# Patient Record
Sex: Male | Born: 2000 | Race: Black or African American | Hispanic: No | Marital: Single | Smoking: Never smoker
Health system: Southern US, Community
[De-identification: ages and names within clinical notes are randomized; demographics above are authoritative.]

## PROBLEM LIST (undated history)

## (undated) DIAGNOSIS — T7840XA Allergy, unspecified, initial encounter: Secondary | ICD-10-CM

## (undated) DIAGNOSIS — G43909 Migraine, unspecified, not intractable, without status migrainosus: Secondary | ICD-10-CM

## (undated) DIAGNOSIS — J45909 Unspecified asthma, uncomplicated: Secondary | ICD-10-CM

## (undated) HISTORY — DX: Allergy, unspecified, initial encounter: T78.40XA

## (undated) HISTORY — DX: Migraine, unspecified, not intractable, without status migrainosus: G43.909

## (undated) HISTORY — DX: Unspecified asthma, uncomplicated: J45.909

---
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
## 2018-01-05 NOTE — Progress Notes (Deleted)
  HPI:  Using dictation device. Unfortunately this device frequently misinterprets words/phrases. ? tdap  Allergy/Asthma: -meds: advised zyrtec, flonase and singulair daily, alb prn  ROS: See pertinent positives and negatives per HPI.  Past Medical History:  Diagnosis Date  . Allergy   . Asthma   . Migraine     No past surgical history on file.  Family History  Problem Relation Age of Onset  . Diabetes Mother   . Heart disease Father   . Hypertension Father   . Diabetes Maternal Grandmother   . Hypertension Maternal Grandmother   . Heart disease Maternal Grandmother   . Heart disease Other   . Diabetes Other   . Depression Other   . Hypertension Other   . Mental retardation Other   . Asthma Other   . Anemia Other     SOCIAL HX: ***   Current Outpatient Medications:  .  albuterol (PROVENTIL HFA;VENTOLIN HFA) 108 (90 Base) MCG/ACT inhaler, Inhale 2 puffs into the lungs every 6 (six) hours as needed., Disp: 1 Inhaler, Rfl: 1 .  fluticasone (FLONASE) 50 MCG/ACT nasal spray, Place 2 sprays into both nostrils daily., Disp: 16 g, Rfl: 11 .  montelukast (SINGULAIR) 10 MG tablet, Take 1 tablet (10 mg total) by mouth at bedtime., Disp: 30 tablet, Rfl: 3 .  NONFORMULARY OR COMPOUNDED ITEM, Lidocaine Nasal Spray  Use Q2 hours PRN headache, Disp: 1 each, Rfl: 1 .  SUMAtriptan (IMITREX) 50 MG tablet, Take 1 tablet (50 mg total) by mouth every 2 (two) hours as needed for migraine. May repeat in 2 hours if headache persists or recurs., Disp: 27 tablet, Rfl: 4  EXAM:  There were no vitals filed for this visit.  There is no height or weight on file to calculate BMI.  GENERAL: vitals reviewed and listed above, alert, oriented, appears well hydrated and in no acute distress  HEENT: atraumatic, conjunttiva clear, no obvious abnormalities on inspection of external nose and ears  NECK: no obvious masses on inspection  LUNGS: clear to auscultation bilaterally, no wheezes, rales or  rhonchi, good air movement  CV: HRRR, no peripheral edema  MS: moves all extremities without noticeable abnormality *** PSYCH: pleasant and cooperative, no obvious depression or anxiety  ASSESSMENT AND PLAN:  Discussed the following assessment and plan:  No diagnosis found.  *** -Patient advised to return or notify a doctor immediately if symptoms worsen or persist or new concerns arise.  There are no Patient Instructions on file for this visit.  Terressa KoyanagiHannah R Ninnie Fein, DO

## 2018-01-06 ENCOUNTER — Ambulatory Visit: Payer: Managed Care, Other (non HMO) | Admitting: Family Medicine

## 2018-01-06 DIAGNOSIS — Z0289 Encounter for other administrative examinations: Secondary | ICD-10-CM

## 2018-02-22 ENCOUNTER — Encounter: Payer: Self-pay | Admitting: Family Medicine

## 2018-02-22 ENCOUNTER — Ambulatory Visit: Payer: Managed Care, Other (non HMO) | Admitting: Family Medicine

## 2018-02-22 VITALS — BP 118/62 | HR 58 | Temp 98.5°F | Ht 74.2 in | Wt 188.0 lb

## 2018-02-22 DIAGNOSIS — R079 Chest pain, unspecified: Secondary | ICD-10-CM

## 2018-02-22 NOTE — Patient Instructions (Signed)
It was very nice to see you today!  You have a strain in one of the muscles in your chest wall.  You have no signs of heart damage or a heart blockage.  You can take ibuprofen or naproxen as needed for pain.  Please seek medical care if your symptoms worsen or do not improve over the next couple weeks.   Take care, Dr Jimmey RalphParker   Chest Wall Pain Chest wall pain is pain in or around the bones and muscles of your chest. Sometimes, an injury causes this pain. Sometimes, the cause may not be known. This pain may take several weeks or longer to get better. Follow these instructions at home: Pay attention to any changes in your symptoms. Take these actions to help with your pain:  Rest as told by your doctor.  Avoid activities that cause pain. Try not to use your chest, belly (abdominal), or side muscles to lift heavy things.  If directed, apply ice to the painful area: ? Put ice in a plastic bag. ? Place a towel between your skin and the bag. ? Leave the ice on for 20 minutes, 2-3 times per day.  Take over-the-counter and prescription medicines only as told by your doctor.  Do not use tobacco products, including cigarettes, chewing tobacco, and e-cigarettes. If you need help quitting, ask your doctor.  Keep all follow-up visits as told by your doctor. This is important.  Contact a doctor if:  You have a fever.  Your chest pain gets worse.  You have new symptoms. Get help right away if:  You feel sick to your stomach (nauseous) or you throw up (vomit).  You feel sweaty or light-headed.  You have a cough with phlegm (sputum) or you cough up blood.  You are short of breath. This information is not intended to replace advice given to you by your health care provider. Make sure you discuss any questions you have with your health care provider. Document Released: 01/09/2008 Document Revised: 12/29/2015 Document Reviewed: 10/18/2014 Elsevier Interactive Patient Education  [employer]2018  [employer]

## 2018-02-22 NOTE — Progress Notes (Signed)
   Subjective:  Ryan Simpson is a 17 y.o. male who presents today for same-day appointment with a chief complaint of chest pain.   HPI:  Chest Pain, Acute problem Started 1 week ago. Worsening over that time. Located to right chest wall.  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  He has tried using icy hot which has not helped.  No shortness of breath.  Worse with certain movements, deep inspiration, and cough.  Father recently had a heart stent placed and he is worried about a arterial blockage.  ROS: Per HPI  PMH: He reports that he has never smoked. He has never used smokeless tobacco. He reports that he does not drink alcohol or use drugs.  Objective:  Physical Exam: BP 118/62 (BP Location: Left Arm, Patient Position: Sitting, Cuff Size: Normal)   Pulse (!) 58   Temp 98.5 F (36.9 C) (Oral)   Ht 6' 2.2" (1.885 m)   Wt 188 lb (85.3 kg)   SpO2 98%   BMI 24.01 kg/m   Gen: NAD, resting comfortably CV: RRR with no murmurs appreciated Pulm: NWOB, CTAB with no crackles, wheezes, or rhonchi GI: Normal bowel sounds present. Soft, Nontender, Nondistended. MSK:  -Chest: Very tender to palpation over right pectoralis muscle.   EKG: Sinus bradycardia.  No ischemic changes.  Assessment/Plan:  Chest wall pain Likely secondary to pectoralis strain.  EKG today without ischemic changes.  No other red flag signs or symptoms.  Discussed treatment options with patient.  Offered prescription NSAID, however patient declined.  Recommended over-the-counter NSAIDs as needed.  Discussed reasons to return to care.  Follow up as needed.  Katina Degreealeb M. Jimmey RalphParker, MD 02/22/2018 12:59 PM

## 2018-04-02 ENCOUNTER — Emergency Department (HOSPITAL_COMMUNITY): Payer: Managed Care, Other (non HMO)

## 2018-04-02 ENCOUNTER — Other Ambulatory Visit: Payer: Self-pay

## 2018-04-02 ENCOUNTER — Emergency Department (HOSPITAL_COMMUNITY)
Admission: EM | Admit: 2018-04-02 | Discharge: 2018-04-03 | Disposition: A | Discharge status: Home / Self Care | Payer: Managed Care, Other (non HMO) | Attending: Emergency Medicine | Admitting: Emergency Medicine

## 2018-04-02 ENCOUNTER — Encounter (HOSPITAL_COMMUNITY): Payer: Self-pay

## 2018-04-02 DIAGNOSIS — Y929 Unspecified place or not applicable: Secondary | ICD-10-CM | POA: Diagnosis not present

## 2018-04-02 DIAGNOSIS — S6292XA Unspecified fracture of left wrist and hand, initial encounter for closed fracture: Secondary | ICD-10-CM | POA: Insufficient documentation

## 2018-04-02 DIAGNOSIS — Y939 Activity, unspecified: Secondary | ICD-10-CM | POA: Insufficient documentation

## 2018-04-02 DIAGNOSIS — J45909 Unspecified asthma, uncomplicated: Secondary | ICD-10-CM | POA: Diagnosis not present

## 2018-04-02 DIAGNOSIS — Y999 Unspecified external cause status: Secondary | ICD-10-CM | POA: Diagnosis not present

## 2018-04-02 DIAGNOSIS — S6992XA Unspecified injury of left wrist, hand and finger(s), initial encounter: Secondary | ICD-10-CM | POA: Diagnosis present

## 2018-04-02 MED ORDER — ACETAMINOPHEN 325 MG PO TABS
650.0000 mg | ORAL_TABLET | Freq: Once | ORAL | Status: AC
  Administered 2018-04-02: 650 mg via ORAL
  Filled 2018-04-02: qty 2

## 2018-04-02 NOTE — Discharge Instructions (Addendum)
Please keep splint clean and dry. I have provided a referral to a hand specialist, please schedule an appointment this week. You may alternate ibuprofen or tylenol for pain. If you experience any weakness in your hands, tingling or pain out of proportion return to the ED for reevaluation.
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
## 2018-04-03 NOTE — Progress Notes (Signed)
Orthopedic Tech Progress Note Patient Details:  Ryan Simpson 04/22/2001 [id number]  Ortho Devices Type of Ortho Device: Arm sling, Ulna gutter splint Ortho Device/Splint Location: lue Ortho Device/Splint Interventions: Ordered, Application, Adjustment   Post Interventions Patient Tolerated: Well Instructions Provided: Care of device, Adjustment of device   Trinna PostMartinez, Chrsitopher Wik J 04/03/2018, 12:25 AM

## 2018-04-06 ENCOUNTER — Emergency Department (HOSPITAL_COMMUNITY): Payer: Managed Care, Other (non HMO)

## 2018-04-06 ENCOUNTER — Emergency Department (HOSPITAL_COMMUNITY)
Admission: EM | Admit: 2018-04-06 | Discharge: 2018-04-06 | Disposition: A | Discharge status: Home / Self Care | Payer: Managed Care, Other (non HMO) | Attending: Emergency Medicine | Admitting: Emergency Medicine

## 2018-04-06 ENCOUNTER — Encounter (HOSPITAL_COMMUNITY): Payer: Self-pay | Admitting: Emergency Medicine

## 2018-04-06 DIAGNOSIS — J45909 Unspecified asthma, uncomplicated: Secondary | ICD-10-CM | POA: Diagnosis not present

## 2018-04-06 DIAGNOSIS — S62345D Nondisplaced fracture of base of fourth metacarpal bone, left hand, subsequent encounter for fracture with routine healing: Secondary | ICD-10-CM

## 2018-04-06 DIAGNOSIS — R202 Paresthesia of skin: Secondary | ICD-10-CM

## 2018-04-06 DIAGNOSIS — Y33XXXD Other specified events, undetermined intent, subsequent encounter: Secondary | ICD-10-CM | POA: Diagnosis not present

## 2018-04-06 DIAGNOSIS — M79642 Pain in left hand: Secondary | ICD-10-CM | POA: Diagnosis present

## 2018-04-06 MED ORDER — NAPROXEN 250 MG PO TABS
500.0000 mg | ORAL_TABLET | Freq: Once | ORAL | Status: AC
  Administered 2018-04-06: 500 mg via ORAL
  Filled 2018-04-06: qty 2

## 2018-04-06 MED ORDER — GABAPENTIN 300 MG PO CAPS: 300.0000 mg | ORAL_CAPSULE | Freq: Three times a day (TID) | ORAL | 0 refills | Status: AC

## 2018-04-06 NOTE — ED Notes (Signed)
Patient states he had a cast put on 8/28  C/o itching and burning to the top of his hand, so he took the cast off this am.

## 2018-04-06 NOTE — ED Notes (Signed)
Ortho tech here 

## 2018-04-06 NOTE — ED Notes (Signed)
Pt waiting on the ortho tech

## 2018-04-06 NOTE — ED Notes (Signed)
Ortho tech reports that he is coming in a few minutes

## 2018-04-06 NOTE — ED Notes (Signed)
Still waiting for othro tech

## 2018-04-06 NOTE — ED Notes (Signed)
Pt still waiting on the ortho tech  Ortho tech re-paged

## 2018-04-06 NOTE — Progress Notes (Signed)
Orthopedic Tech Progress Note Patient Details:  Ryan Simpson 04-04-2001 [id number]  Ortho Devices Type of Ortho Device: Ace wrap, Ulna gutter splint Ortho Device/Splint Location: lue Ortho Device/Splint Interventions: Application   Post Interventions Patient Tolerated: Well Instructions Provided: Care of device   [name] 04/06/2018, 5:23 PM

## 2018-04-06 NOTE — Discharge Instructions (Signed)
Please elevate the left arm and hand to reduce swelling Take Ibuprofen 600mg  for pain three times daily Take Gabapentin as needed for nerve pain Follow up with orthopedics on Thursday
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
## 2018-04-06 NOTE — ED Triage Notes (Signed)
Pt was seen here 8/28 and had a splint placed on his left hand. States he took it off because it was "burning his hand." Pt states his hand still feels like it is "burning." no new injuries. Pt has not followed up, his apt is Thursday.

## 2020-02-16 ENCOUNTER — Encounter (HOSPITAL_COMMUNITY): Payer: Self-pay | Admitting: Emergency Medicine

## 2020-02-16 ENCOUNTER — Emergency Department (HOSPITAL_COMMUNITY)
Admission: EM | Admit: 2020-02-16 | Discharge: 2020-02-17 | Discharge status: Against Medical Advice | Payer: 59 | Attending: Emergency Medicine | Admitting: Emergency Medicine

## 2020-02-16 ENCOUNTER — Other Ambulatory Visit: Payer: Self-pay

## 2020-02-16 DIAGNOSIS — R197 Diarrhea, unspecified: Secondary | ICD-10-CM | POA: Insufficient documentation

## 2020-02-16 DIAGNOSIS — R110 Nausea: Secondary | ICD-10-CM | POA: Insufficient documentation

## 2020-02-16 DIAGNOSIS — R531 Weakness: Secondary | ICD-10-CM | POA: Insufficient documentation

## 2020-02-16 DIAGNOSIS — Z5321 Procedure and treatment not carried out due to patient leaving prior to being seen by health care provider: Secondary | ICD-10-CM | POA: Diagnosis not present

## 2020-02-16 LAB — COMPREHENSIVE METABOLIC PANEL
ALT: 25 U/L (ref 0–44)
AST: 25 U/L (ref 15–41)
Albumin: 4.3 g/dL (ref 3.5–5.0)
Alkaline Phosphatase: 69 U/L (ref 38–126)
Anion gap: 11 (ref 5–15)
BUN: 18 mg/dL (ref 6–20)
CO2: 26 mmol/L (ref 22–32)
Calcium: 9.2 mg/dL (ref 8.9–10.3)
Chloride: 99 mmol/L (ref 98–111)
Creatinine, Ser: 1.21 mg/dL (ref 0.61–1.24)
GFR calc Af Amer: >60 mL/min (ref >60)
GFR calc non Af Amer: >60 mL/min (ref >60)
Glucose, Bld: 94 mg/dL (ref 70–99)
Potassium: 3.7 mmol/L (ref 3.5–5.1)
Sodium: 136 mmol/L (ref 135–145)
Total Bilirubin: 2.7 mg/dL — ABNORMAL HIGH (ref 0.3–1.2)
Total Protein: 7.6 g/dL (ref 6.5–8.1)

## 2020-02-16 LAB — CBC
HCT: 46.5 % (ref 39.0–52.0)
Hemoglobin: 15.3 g/dL (ref 13.0–17.0)
MCH: 28.7 pg (ref 26.0–34.0)
MCHC: 32.9 g/dL (ref 30.0–36.0)
MCV: 87.1 fL (ref 80.0–100.0)
Platelets: 263 10*3/uL (ref 150–400)
RBC: 5.34 MIL/uL (ref 4.22–5.81)
RDW: 11.9 % (ref 11.5–15.5)
WBC: 6.3 10*3/uL (ref 4.0–10.5)
nRBC: 0 % (ref 0.0–0.2)

## 2020-02-16 LAB — LIPASE, BLOOD: Lipase: 20 U/L (ref 11–51)

## 2020-02-16 LAB — URINALYSIS, ROUTINE W REFLEX MICROSCOPIC
Bacteria, UA: NONE SEEN
Bilirubin Urine: NEGATIVE
Glucose, UA: NEGATIVE mg/dL
Hgb urine dipstick: NEGATIVE
Ketones, ur: 80 mg/dL — AB
Nitrite: NEGATIVE
Protein, ur: 100 mg/dL — AB
Specific Gravity, Urine: 1.039 — ABNORMAL HIGH (ref 1.005–1.030)
pH: 5 (ref 5.0–8.0)

## 2020-02-16 MED ORDER — SODIUM CHLORIDE 0.9% FLUSH: 3.0000 mL | Freq: Once | INTRAVENOUS | Status: DC
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
## 2020-02-16 NOTE — ED Notes (Signed)
Pt did not answer for vitals check.

## 2020-02-17 NOTE — ED Notes (Signed)
No answer for VS x 2 

## 2020-07-28 ENCOUNTER — Other Ambulatory Visit: Payer: Self-pay

## 2020-07-28 ENCOUNTER — Ambulatory Visit (HOSPITAL_COMMUNITY)
Admission: RE | Admit: 2020-07-28 | Discharge: 2020-07-28 | Disposition: A | Discharge status: Home / Self Care | Payer: 59 | Source: Ambulatory Visit | Attending: Family Medicine | Admitting: Family Medicine

## 2020-07-28 ENCOUNTER — Encounter (HOSPITAL_COMMUNITY): Payer: Self-pay

## 2020-07-28 VITALS — BP 115/77 | HR 68 | Temp 98.2°F | Resp 17

## 2020-07-28 DIAGNOSIS — U071 COVID-19: Secondary | ICD-10-CM | POA: Insufficient documentation

## 2020-07-28 DIAGNOSIS — Z711 Person with feared health complaint in whom no diagnosis is made: Secondary | ICD-10-CM | POA: Diagnosis not present

## 2020-07-28 DIAGNOSIS — R079 Chest pain, unspecified: Secondary | ICD-10-CM | POA: Insufficient documentation

## 2020-07-28 NOTE — Discharge Instructions (Addendum)
We will call with any positive.  Please follow up if your symptoms fail to improve.  

## 2020-07-28 NOTE — ED Provider Notes (Signed)
[facility]    CSN: [id number] Arrival date & time: 07/28/20  6384      History   Chief Complaint Chief Complaint  Patient presents with  . Appointment  . STD Testing   . Torso Pain    HPI TAHIR BLANK is a 19 y.o. male. He is presenting with concern for exposure to a sexually transmitted infection. Recent had an experience with a partner that had tested possible for gonorrhea. Their partner tested positive for syphilis. He is also having left sided chest pain. The pain is constant and mild. Doesn't appear to be associated with any specific trigger. No shortness of breath or diaphoresis.   HPI  Past Medical History:  Diagnosis Date  . Allergy   . Asthma   . Migraine     Patient Active Problem List   Diagnosis Date Noted  . Chronic migraine without aura 04/30/2016  . Asthma 09/27/2014  . Thoracolumbar back pain 07/13/2014  . Idiopathic scoliosis 07/13/2014    History reviewed. No pertinent surgical history.     Home Medications    Prior to Admission medications   Medication Sig Start Date End Date Taking? Authorizing Provider  albuterol (PROVENTIL HFA;VENTOLIN HFA) 108 (90 Base) MCG/ACT inhaler Inhale 2 puffs into the lungs every 6 (six) hours as needed. Patient taking differently: Inhale 2 puffs into the lungs every 6 (six) hours as needed for wheezing or shortness of breath.  11/05/17   Terressa Koyanagi, DO  fluticasone (FLONASE) 50 MCG/ACT nasal spray Place 2 sprays into both nostrils daily. Patient not taking: Reported on 04/02/2018 12/09/14   Ethelda Chick, MD  gabapentin (NEURONTIN) 300 MG capsule Take 1 capsule (300 mg total) by mouth 3 (three) times daily. 04/06/18   Bethel Born, PA-C  montelukast (SINGULAIR) 10 MG tablet Take 1 tablet (10 mg total) by mouth at bedtime. Patient not taking: Reported on 04/02/2018 11/05/17   Terressa Koyanagi, DO  NONFORMULARY OR COMPOUNDED ITEM Lidocaine Nasal Spray  Use Q2 hours PRN headache 04/05/16   Porfirio Oar, PA  SUMAtriptan (IMITREX) 50 MG tablet Take 1 tablet (50 mg total) by mouth every 2 (two) hours as needed for migraine. May repeat in 2 hours if headache persists or recurs. 04/30/16   Levert Feinstein, MD    Family History Family History  Problem Relation Age of Onset  . Diabetes Mother   . Heart disease Father   . Hypertension Father   . Diabetes Maternal Grandmother   . Hypertension Maternal Grandmother   . Heart disease Maternal Grandmother   . Heart disease Other   . Diabetes Other   . Depression Other   . Hypertension Other   . Mental retardation Other   . Asthma Other   . Anemia Other     Social History Social History   Tobacco Use  . Smoking status: Never Smoker  . Smokeless tobacco: Never Used  Substance Use Topics  . Alcohol use: No    Alcohol/week: 0.0 standard drinks  . Drug use: No     Allergies   Sulfur   Review of Systems Review of Systems  See HPI   Physical Exam Triage Vital Signs ED Triage Vitals [07/28/20 1925]  Enc Vitals Group     BP      Pulse      Resp      Temp      Temp src      SpO2  Weight      Height      Head Circumference      Peak Flow      Pain Score 5     Pain Loc      Pain Edu?      Excl. in GC?    No data found.  Updated Vital Signs BP 115/77 (BP Location: Right Arm)   Pulse 68   Temp 98.2 F (36.8 C) (Oral)   Resp 17   SpO2 93%   Visual Acuity Right Eye Distance:   Left Eye Distance:   Bilateral Distance:    Right Eye Near:   Left Eye Near:    Bilateral Near:     Physical Exam Gen: NAD, alert, cooperative with exam, well-appearing ENT: normal lips, normal nasal mucosa,  Eye: normal EOM, normal conjunctiva and lids CV:  RRR  Resp: no accessory muscle use, non-labored,  Skin: no rashes, no areas of induration  Neuro: normal tone, normal sensation to touch Psych:  normal insight, alert and oriented MSK:  TTP of the mid clavicular line below the left nipple \    UC Treatments / Results   Labs (all labs ordered are listed, but only abnormal results are displayed) Labs Reviewed  SARS CORONAVIRUS 2 (TAT 6-24 HRS)  RPR  CYTOLOGY, (ORAL, ANAL, URETHRAL) ANCILLARY ONLY    EKG   Radiology No results found.  Procedures Procedures (including critical care time)  Medications Ordered in UC Medications - No data to display  Initial Impression / Assessment and Plan / UC Course  I have reviewed the triage vital signs and the nursing notes.  Pertinent labs & imaging results that were available during my care of the patient were reviewed by me and considered in my medical decision making (see chart for details).     Mr. Shives is a 19 year old male that is presenting with concern for sexually transmitted infection.  Swab was obtained.  RPR was obtained as well.  Having some chest wall pain that could be related to costochondritis.  Possibly related to Covid.  Covid swab was obtained.  Could consider anti-inflammatories or further testing if ongoing chest wall pain.  Counseled on supportive care.  Final Clinical Impressions(s) / UC Diagnoses   Final diagnoses:  Chest pain, unspecified type  Concern about STD in male without diagnosis     Discharge Instructions     We will call with any positive  Please follow up if your symptoms fail to improve.     ED Prescriptions    None     PDMP not reviewed this encounter.   Myra Rude, MD 07/28/20 2106

## 2020-07-28 NOTE — ED Triage Notes (Signed)
Pt presents for STD Testing after possible exposure from partner; pt also presents with left rib/torso pain non injury related for past few days.

## 2020-07-29 LAB — SARS CORONAVIRUS 2 (TAT 6-24 HRS): SARS Coronavirus 2: POSITIVE — AB

## 2020-07-29 LAB — RPR: RPR Ser Ql: NONREACTIVE

## 2020-07-31 ENCOUNTER — Telehealth: Payer: Self-pay | Admitting: Physician Assistant

## 2020-07-31 NOTE — Telephone Encounter (Signed)
Called to discuss with Ula Lingo about Covid symptoms and the use of  monoclonal antibody infusion for those with mild to moderate Covid symptoms and at a high risk of hospitalization.    Pt does not qualify for infusion therapy as his symptoms first presented > 7 days prior to timing of infusion. Symptoms tier reviewed as well as criteria for ending isolation. Preventative practices reviewed. Patient verbalized understanding   Symptoms onset: 12/17   Patient Active Problem List   Diagnosis Date Noted  . Chronic migraine without aura 04/30/2016  . Asthma 09/27/2014  . Thoracolumbar back pain 07/13/2014  . Idiopathic scoliosis 07/13/2014     Manson Passey, PA-C

## 2020-08-01 ENCOUNTER — Telehealth: Payer: Self-pay | Admitting: Family Medicine

## 2020-08-01 LAB — CYTOLOGY, (ORAL, ANAL, URETHRAL) ANCILLARY ONLY
Chlamydia: NEGATIVE
Comment: NEGATIVE
Comment: NEGATIVE
Comment: NORMAL
Neisseria Gonorrhea: NEGATIVE
Trichomonas: NEGATIVE

## 2020-08-01 NOTE — Telephone Encounter (Signed)
Informed of negative RPR   Myra Rude, MD [facility] 08/01/2020, 11:27 AM

## 2020-08-03 ENCOUNTER — Telehealth: Payer: Self-pay | Admitting: Family Medicine

## 2020-08-03 NOTE — Telephone Encounter (Signed)
Informed of results.   Myra Rude, MD [facility] 08/03/2020, 8:21 AM

## 2020-10-26 ENCOUNTER — Encounter (HOSPITAL_COMMUNITY): Payer: Self-pay | Admitting: Emergency Medicine

## 2020-10-26 ENCOUNTER — Other Ambulatory Visit: Payer: Self-pay

## 2020-10-26 ENCOUNTER — Ambulatory Visit (HOSPITAL_COMMUNITY)
Admission: EM | Admit: 2020-10-26 | Discharge: 2020-10-26 | Disposition: A | Discharge status: Home / Self Care | Payer: 59

## 2020-10-26 DIAGNOSIS — B349 Viral infection, unspecified: Secondary | ICD-10-CM

## 2020-10-26 DIAGNOSIS — Z202 Contact with and (suspected) exposure to infections with a predominantly sexual mode of transmission: Secondary | ICD-10-CM

## 2020-10-26 NOTE — ED Provider Notes (Signed)
[facility]    CSN: [id number] Arrival date & time: 10/26/20  1711      History   Chief Complaint Chief Complaint  Patient presents with  . SEXUALLY TRANSMITTED DISEASE    HPI Ryan Simpson is a 20 y.o. male.   Ryan Simpson is a 20 y.o. with chief complaint of ear pain, sinus pain, and fatigue that began about 1 week ago.  Reports brother had similar symptoms.  Reports taking OTC medications which helped relief symptoms. Denies any alleviating or aggravating factors.   Also reports that sexual partner informed him of testing positive for herpes.  Denies any dysuria, urgency, frequency, hematuria, or discharge.  Denies any genital lesions.   Denies any fevers, chest pain, shortness of breath, N/V/D, abdominal pain, or headaches.    ROS: As per HPI, all other pertinent ROS negative   The history is provided by the patient.    Past Medical History:  Diagnosis Date  . Allergy   . Asthma   . Migraine     Patient Active Problem List   Diagnosis Date Noted  . Chronic migraine without aura 04/30/2016  . Asthma 09/27/2014  . Thoracolumbar back pain 07/13/2014  . Idiopathic scoliosis 07/13/2014    History reviewed. No pertinent surgical history.     Home Medications    Prior to Admission medications   Medication Sig Start Date End Date Taking? Authorizing Provider  SUMAtriptan (IMITREX) 50 MG tablet Take 1 tablet (50 mg total) by mouth every 2 (two) hours as needed for migraine. May repeat in 2 hours if headache persists or recurs. 04/30/16  Yes Levert Feinstein, MD  albuterol (PROVENTIL HFA;VENTOLIN HFA) 108 (90 Base) MCG/ACT inhaler Inhale 2 puffs into the lungs every 6 (six) hours as needed. Patient taking differently: Inhale 2 puffs into the lungs every 6 (six) hours as needed for wheezing or shortness of breath.  11/05/17   Terressa Koyanagi, DO  fluticasone (FLONASE) 50 MCG/ACT nasal spray Place 2 sprays into both nostrils daily. Patient not taking: Reported  on 04/02/2018 12/09/14   Ethelda Chick, MD  gabapentin (NEURONTIN) 300 MG capsule Take 1 capsule (300 mg total) by mouth 3 (three) times daily. 04/06/18   Bethel Born, PA-C  montelukast (SINGULAIR) 10 MG tablet Take 1 tablet (10 mg total) by mouth at bedtime. Patient not taking: Reported on 04/02/2018 11/05/17   Terressa Koyanagi, DO  NONFORMULARY OR COMPOUNDED ITEM Lidocaine Nasal Spray  Use Q2 hours PRN headache 04/05/16   Porfirio Oar, PA    Family History Family History  Problem Relation Age of Onset  . Diabetes Mother   . Heart disease Father   . Hypertension Father   . Diabetes Maternal Grandmother   . Hypertension Maternal Grandmother   . Heart disease Maternal Grandmother   . Heart disease Other   . Diabetes Other   . Depression Other   . Hypertension Other   . Mental retardation Other   . Asthma Other   . Anemia Other     Social History Social History   Tobacco Use  . Smoking status: Never Smoker  . Smokeless tobacco: Never Used  Substance Use Topics  . Alcohol use: No    Alcohol/week: 0.0 standard drinks  . Drug use: No     Allergies   Elemental sulfur   Review of Systems Review of Systems  Constitutional: Positive for fatigue. Negative for fever.  HENT: Positive for ear pain and sinus pressure.  Genitourinary: Negative for genital sores, penile discharge, penile pain, penile swelling, scrotal swelling, testicular pain and urgency.     Physical Exam Triage Vital Signs ED Triage Vitals  Enc Vitals Group     BP 10/26/20 1755 125/75     Pulse Rate 10/26/20 1755 70     Resp 10/26/20 1755 18     Temp 10/26/20 1755 (!) 97.3 F (36.3 C)     Temp Source 10/26/20 1755 Oral     SpO2 10/26/20 1755 97 %     Weight --      Height --      Head Circumference --      Peak Flow --      Pain Score 10/26/20 1752 0     Pain Loc --      Pain Edu? --      Excl. in GC? --    No data found.  Updated Vital Signs BP 125/75 (BP Location: Left Arm)   Pulse 70    Temp (!) 97.3 F (36.3 C) (Oral)   Resp 18   SpO2 97%   Visual Acuity Right Eye Distance:   Left Eye Distance:   Bilateral Distance:    Right Eye Near:   Left Eye Near:    Bilateral Near:     Physical Exam Vitals and nursing note reviewed.  Constitutional:      General: He is not in acute distress.    Appearance: Normal appearance. He is not ill-appearing, toxic-appearing or diaphoretic.  HENT:     Head: Normocephalic and atraumatic.     Right Ear: Tympanic membrane, ear canal and external ear normal.     Left Ear: Tympanic membrane, ear canal and external ear normal.     Nose: Nose normal. No nasal tenderness.     Right Sinus: No maxillary sinus tenderness or frontal sinus tenderness.     Left Sinus: No maxillary sinus tenderness or frontal sinus tenderness.     Mouth/Throat:     Mouth: Mucous membranes are moist.     Pharynx: Oropharynx is clear.  Eyes:     Conjunctiva/sclera: Conjunctivae normal.     Pupils: Pupils are equal, round, and reactive to light.  Cardiovascular:     Rate and Rhythm: Normal rate.     Pulses: Normal pulses.  Pulmonary:     Effort: Pulmonary effort is normal.  Abdominal:     General: Abdomen is flat.  Genitourinary:    Comments: declines Musculoskeletal:        General: Normal range of motion.     Cervical back: Normal range of motion.  Skin:    General: Skin is warm and dry.  Neurological:     General: No focal deficit present.     Mental Status: He is alert and oriented to person, place, and time.  Psychiatric:        Mood and Affect: Mood normal.      UC Treatments / Results  Labs (all labs ordered are listed, but only abnormal results are displayed) Labs Reviewed - No data to display  EKG   Radiology No results found.  Procedures Procedures (including critical care time)  Medications Ordered in UC Medications - No data to display  Initial Impression / Assessment and Plan / UC Course  I have reviewed the triage  vital signs and the nursing notes.  Pertinent labs & imaging results that were available during my care of the patient were reviewed by me and considered in  my medical decision making (see chart for details).     Viral Illness 1. Assessment negative for red flags or concerns, including sinus or ear infections.  Continue OTC medications.  Use Flonase 2 sprays in each nostril daily.  2. Tylenol and/or Ibuprofen for pain relief and fever reduction 3. Encourage fluids and rest  Exposure to STD 1. Assessment negative for red flags or concerns.  Declined GU exam but denies any sores or lesions 2. Offered STD testing for gonorrhea, chlamydia, and trich but declines.   3. Encouraged safe sex practices and condom use 4. Follow up if develops any dysuria, penile pain or swelling, burning, or develop of lesions.    Final Clinical Impressions(s) / UC Diagnoses   Final diagnoses:  Viral illness  Potential exposure to STD     Discharge Instructions     Continue to use your over-the-counter medications for your headache and ear pain.  Use Flonase 2 sprays in each nostril daily.  You can take Tylenol/Ibuprofen as needed for pain relief and fever reduction.    Use a condom or other barrier method for all sexual encounters.  Follow up if you develop any painful urination, penile pain, discharge, or sores on your genitals.        ED Prescriptions    None     PDMP not reviewed this encounter.   Ivette Loyal, NP 10/26/20 [phone]

## 2020-10-26 NOTE — ED Triage Notes (Signed)
Patient reports he was notified by a partner that he has been exposed to herpes.  Denies sores. Denies penile discharge'   Bilateral ear stuffiness, coughing, runny nose.  These symptoms started one week ago

## 2020-10-26 NOTE — Discharge Instructions (Signed)
Continue to use your over-the-counter medications for your headache and ear pain.  Use Flonase 2 sprays in each nostril daily.  You can take Tylenol/Ibuprofen as needed for pain relief and fever reduction.    Use a condom or other barrier method for all sexual encounters.  Follow up if you develop any painful urination, penile pain, discharge, or sores on your genitals.

## 2021-08-18 ENCOUNTER — Encounter (HOSPITAL_COMMUNITY): Payer: Self-pay

## 2021-08-18 ENCOUNTER — Ambulatory Visit (HOSPITAL_COMMUNITY)
Admission: EM | Admit: 2021-08-18 | Discharge: 2021-08-18 | Disposition: A | Discharge status: Home / Self Care | Payer: Self-pay | Attending: Physician Assistant | Admitting: Physician Assistant

## 2021-08-18 ENCOUNTER — Other Ambulatory Visit: Payer: Self-pay

## 2021-08-18 DIAGNOSIS — Z113 Encounter for screening for infections with a predominantly sexual mode of transmission: Secondary | ICD-10-CM

## 2021-08-18 DIAGNOSIS — N489 Disorder of penis, unspecified: Secondary | ICD-10-CM

## 2021-08-18 NOTE — ED Triage Notes (Signed)
Pt presents for STD testing.   States he does not have sxs.

## 2021-08-18 NOTE — Discharge Instructions (Signed)
We will contact you if any of your testing is positive.  Please do not engage in sexual activity until your results have returned and you have completed treatment if necessary.  You should also wait until lesions have healed.  Use hypoallergenic soaps and detergents and apply Aquaphor or Vaseline.  If anything worsens and you have penile discharge or persistent lesions you need to return for reevaluation at which point we would need to consider testing for other sexually transmitted infections including HIV/syphilis/hepatitis.  It is important use a condom with each sexual encounter.  Please return next week if symptoms have not resolved.

## 2021-08-18 NOTE — ED Provider Notes (Signed)
[facility]    CSN: [id number] Arrival date & time: 08/18/21  1256      History   Chief Complaint Chief Complaint  Patient presents with   SEXUALLY TRANSMITTED DISEASE    HPI Ryan Simpson is a 21 y.o. male.   Patient presents today with a several week history of intermittent ulcerated lesions at the base of his penis.  He denies any vesicular lesions but reports episodes have been ongoing for many months and typically resolve with application of emollient only to recur later on.  He has not changed any personal hygiene products including soaps or detergents.  He denies formal diagnosis of herpes simplex.  He denies any additional symptoms including discharge, urinary frequency, urinary urgency, dysuria, abdominal pain, pelvic pain.  He is sexually active and does not consistently use condoms.  He is interested in STI testing but declined HIV/hepatitis/syphilis as he denies any specific exposures and does not believe he is at risk for these conditions.    Past Medical History:  Diagnosis Date   Allergy    Asthma    Migraine     Patient Active Problem List   Diagnosis Date Noted   Chronic migraine without aura 04/30/2016   Asthma 09/27/2014   Thoracolumbar back pain 07/13/2014   Idiopathic scoliosis 07/13/2014    History reviewed. No pertinent surgical history.     Home Medications    Prior to Admission medications   Medication Sig Start Date End Date Taking? Authorizing Provider  albuterol (PROVENTIL HFA;VENTOLIN HFA) 108 (90 Base) MCG/ACT inhaler Inhale 2 puffs into the lungs every 6 (six) hours as needed. Patient taking differently: Inhale 2 puffs into the lungs every 6 (six) hours as needed for wheezing or shortness of breath.  11/05/17   Terressa Koyanagi, DO  fluticasone (FLONASE) 50 MCG/ACT nasal spray Place 2 sprays into both nostrils daily. Patient not taking: Reported on 04/02/2018 12/09/14   Ethelda Chick, MD  gabapentin (NEURONTIN) 300 MG capsule  Take 1 capsule (300 mg total) by mouth 3 (three) times daily. 04/06/18   Bethel Born, PA-C  montelukast (SINGULAIR) 10 MG tablet Take 1 tablet (10 mg total) by mouth at bedtime. Patient not taking: Reported on 04/02/2018 11/05/17   Terressa Koyanagi, DO  NONFORMULARY OR COMPOUNDED ITEM Lidocaine Nasal Spray  Use Q2 hours PRN headache 04/05/16   Porfirio Oar, PA  SUMAtriptan (IMITREX) 50 MG tablet Take 1 tablet (50 mg total) by mouth every 2 (two) hours as needed for migraine. May repeat in 2 hours if headache persists or recurs. 04/30/16   Levert Feinstein, MD    Family History Family History  Problem Relation Age of Onset   Diabetes Mother    Heart disease Father    Hypertension Father    Diabetes Maternal Grandmother    Hypertension Maternal Grandmother    Heart disease Maternal Grandmother    Heart disease Other    Diabetes Other    Depression Other    Hypertension Other    Mental retardation Other    Asthma Other    Anemia Other     Social History Social History   Tobacco Use   Smoking status: Never   Smokeless tobacco: Never  Substance Use Topics   Alcohol use: No    Alcohol/week: 0.0 standard drinks   Drug use: No     Allergies   Elemental sulfur   Review of Systems Review of Systems  Constitutional:  Negative for activity  change, appetite change, fatigue and fever.  Respiratory:  Negative for cough and shortness of breath.   Cardiovascular:  Negative for chest pain.  Gastrointestinal:  Negative for abdominal pain, diarrhea, nausea and vomiting.  Genitourinary:  Negative for dysuria, frequency, penile discharge, penile pain, penile swelling, scrotal swelling and urgency.    Physical Exam Triage Vital Signs ED Triage Vitals  Enc Vitals Group     BP 08/18/21 1359 138/83     Pulse Rate 08/18/21 1359 66     Resp 08/18/21 1359 20     Temp 08/18/21 1359 98.3 F (36.8 C)     Temp Source 08/18/21 1359 Oral     SpO2 08/18/21 1359 100 %     Weight --      Height  --      Head Circumference --      Peak Flow --      Pain Score 08/18/21 1358 0     Pain Loc --      Pain Edu? --      Excl. in GC? --    No data found.  Updated Vital Signs BP 138/83 (BP Location: Left Arm)    Pulse 66    Temp 98.3 F (36.8 C) (Oral)    Resp 20    SpO2 100%   Visual Acuity Right Eye Distance:   Left Eye Distance:   Bilateral Distance:    Right Eye Near:   Left Eye Near:    Bilateral Near:     Physical Exam Vitals reviewed. Exam conducted with a chaperone present.  Constitutional:      General: He is awake.     Appearance: Normal appearance. He is well-developed. He is not ill-appearing.     Comments: Very pleasant male appears stated age in no acute distress sitting comfortably in exam room  HENT:     Head: Normocephalic and atraumatic.     Mouth/Throat:     Pharynx: No oropharyngeal exudate, posterior oropharyngeal erythema or uvula swelling.  Cardiovascular:     Rate and Rhythm: Normal rate and regular rhythm.     Heart sounds: Normal heart sounds, S1 normal and S2 normal. No murmur heard. Pulmonary:     Effort: Pulmonary effort is normal.     Breath sounds: Normal breath sounds. No stridor. No wheezing, rhonchi or rales.     Comments: Clear to auscultation bilaterally Abdominal:     General: Bowel sounds are normal.     Palpations: Abdomen is soft.     Tenderness: There is no abdominal tenderness.  Genitourinary:    Penis: Circumcised. Lesions present.      Comments: Several ulcerated lesions noted at right base of penis.  No vesicles noted.  No penile discharge. Neurological:     Mental Status: He is alert.  Psychiatric:        Behavior: Behavior is cooperative.     UC Treatments / Results  Labs (all labs ordered are listed, but only abnormal results are displayed) Labs Reviewed  HSV CULTURE AND TYPING  CYTOLOGY, (ORAL, ANAL, URETHRAL) ANCILLARY ONLY    EKG   Radiology No results found.  Procedures Procedures (including  critical care time)  Medications Ordered in UC Medications - No data to display  Initial Impression / Assessment and Plan / UC Course  I have reviewed the triage vital signs and the nursing notes.  Pertinent labs & imaging results that were available during my care of the patient were reviewed by me and  considered in my medical decision making (see chart for details).     Discussed that this is possibly herpes.  We will send off for HSV typing but discussed that given there are no vesicular lesions discussed this could be a false negative and if he has recurrence of symptoms he should return for repeat testing.  Patient declined HIV/hepatitis/RPR.  Low suspicion for lesions being canker given clinical presentation but would recommend RPR testing if symptoms do not resolve with conservative treatment measures and he tested negative for herpes.  STI swab collected today-results pending.  Will make recommendations based laboratory findings.  He was encouraged to continue using hypoallergenic soaps and detergents as well as emollient as this provided relief of symptoms in the past.  He is to abstain from sexual activity until results are obtained and lesions resolved.  Discussed alarm symptoms that warrant emergent evaluation.  Strict return precautions given to which he expressed understanding.  Final Clinical Impressions(s) / UC Diagnoses   Final diagnoses:  Penile lesion  Routine screening for STI (sexually transmitted infection)     Discharge Instructions      We will contact you if any of your testing is positive.  Please do not engage in sexual activity until your results have returned and you have completed treatment if necessary.  You should also wait until lesions have healed.  Use hypoallergenic soaps and detergents and apply Aquaphor or Vaseline.  If anything worsens and you have penile discharge or persistent lesions you need to return for reevaluation at which point we would need to  consider testing for other sexually transmitted infections including HIV/syphilis/hepatitis.  It is important use a condom with each sexual encounter.  Please return next week if symptoms have not resolved.     ED Prescriptions   None    PDMP not reviewed this encounter.   Jeani HawkingRaspet, Travelle Mcclimans K, PA-C 08/18/21 1500

## 2021-08-20 LAB — HSV CULTURE AND TYPING

## 2021-08-21 LAB — CYTOLOGY, (ORAL, ANAL, URETHRAL) ANCILLARY ONLY
Chlamydia: NEGATIVE
Comment: NEGATIVE
Comment: NEGATIVE
Comment: NORMAL
Neisseria Gonorrhea: NEGATIVE
Trichomonas: NEGATIVE

## 2021-10-25 ENCOUNTER — Ambulatory Visit (HOSPITAL_COMMUNITY)
Admission: RE | Admit: 2021-10-25 | Discharge: 2021-10-25 | Disposition: A | Discharge status: Home / Self Care | Payer: PRIVATE HEALTH INSURANCE | Source: Ambulatory Visit | Attending: Family Medicine | Admitting: Family Medicine

## 2021-10-25 ENCOUNTER — Other Ambulatory Visit: Payer: Self-pay

## 2021-10-25 ENCOUNTER — Encounter (HOSPITAL_COMMUNITY): Payer: Self-pay

## 2021-10-25 VITALS — BP 111/73 | HR 64 | Temp 98.1°F | Resp 17 | Ht 74.0 in | Wt 190.0 lb

## 2021-10-25 DIAGNOSIS — M546 Pain in thoracic spine: Secondary | ICD-10-CM

## 2021-10-25 MED ORDER — DICLOFENAC SODIUM 75 MG PO TBEC: 75.0000 mg | DELAYED_RELEASE_TABLET | Freq: Two times a day (BID) | ORAL | 0 refills | Status: AC

## 2021-10-25 MED ORDER — CYCLOBENZAPRINE HCL 10 MG PO TABS: ORAL_TABLET | ORAL | 0 refills | Status: DC
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
## 2021-11-01 ENCOUNTER — Emergency Department (HOSPITAL_COMMUNITY): Payer: PRIVATE HEALTH INSURANCE

## 2021-11-01 ENCOUNTER — Emergency Department (HOSPITAL_COMMUNITY)
Admission: EM | Admit: 2021-11-01 | Discharge: 2021-11-01 | Disposition: A | Discharge status: Home / Self Care | Payer: PRIVATE HEALTH INSURANCE | Attending: Emergency Medicine | Admitting: Emergency Medicine

## 2021-11-01 ENCOUNTER — Encounter (HOSPITAL_COMMUNITY): Payer: Self-pay | Admitting: Emergency Medicine

## 2021-11-01 ENCOUNTER — Other Ambulatory Visit: Payer: Self-pay

## 2021-11-01 DIAGNOSIS — Y92410 Unspecified street and highway as the place of occurrence of the external cause: Secondary | ICD-10-CM | POA: Insufficient documentation

## 2021-11-01 DIAGNOSIS — J45909 Unspecified asthma, uncomplicated: Secondary | ICD-10-CM | POA: Diagnosis not present

## 2021-11-01 DIAGNOSIS — S0990XA Unspecified injury of head, initial encounter: Secondary | ICD-10-CM | POA: Diagnosis present

## 2021-11-01 DIAGNOSIS — S060X0A Concussion without loss of consciousness, initial encounter: Secondary | ICD-10-CM | POA: Insufficient documentation

## 2021-11-01 MED ORDER — ONDANSETRON 4 MG PO TBDP: 4.0000 mg | ORAL_TABLET | Freq: Three times a day (TID) | ORAL | 0 refills | Status: DC | PRN
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
## 2021-11-01 NOTE — Discharge Instructions (Addendum)
You were evaluated in the Emergency Department and after careful evaluation, we did not find any emergent condition requiring admission or further testing in the hospital. ? ?Your exam/testing today is overall reassuring.  Symptoms likely due to a concussion.  Recommend mental and physical rest as we discussed.  Can use MyChart to access your medical records. ? ?Please return to the Emergency Department if you experience any worsening of your condition.   Thank you for allowing [place] to be a part of your care. ?
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
## 2021-12-02 ENCOUNTER — Ambulatory Visit (HOSPITAL_COMMUNITY): Payer: Self-pay

## 2022-11-19 ENCOUNTER — Encounter: Payer: Self-pay | Admitting: *Deleted

## 2023-09-13 ENCOUNTER — Ambulatory Visit (HOSPITAL_COMMUNITY)
Admission: EM | Admit: 2023-09-13 | Discharge: 2023-09-13 | Disposition: A | Discharge status: Home / Self Care | Payer: Self-pay | Attending: Emergency Medicine | Admitting: Emergency Medicine

## 2023-09-13 ENCOUNTER — Encounter (HOSPITAL_COMMUNITY): Payer: Self-pay | Admitting: *Deleted

## 2023-09-13 DIAGNOSIS — B349 Viral infection, unspecified: Secondary | ICD-10-CM

## 2023-09-13 DIAGNOSIS — R112 Nausea with vomiting, unspecified: Secondary | ICD-10-CM

## 2023-09-13 DIAGNOSIS — R197 Diarrhea, unspecified: Secondary | ICD-10-CM

## 2023-09-13 MED ORDER — ONDANSETRON 4 MG PO TBDP: 4.0000 mg | ORAL_TABLET | Freq: Three times a day (TID) | ORAL | 0 refills | Status: AC | PRN

## 2023-09-13 NOTE — ED Triage Notes (Signed)
 Pt states he has a fever last night and has been vomiting, diarrhea today. He took tylenol  last night nothing today.

## 2023-09-13 NOTE — ED Provider Notes (Signed)
 [facility]    CSN: [id number] Arrival date & time: 09/13/23  1927      History   Chief Complaint Chief Complaint  Patient presents with   Fever   Emesis   Headache   Diarrhea    HPI Ryan Simpson is a 23 y.o. male.   Patient presents with nausea, vomiting, diarrhea, abdominal cramping, headache, chills, and fever that began last night. Denies chest pain, shortness of breath, cough, congestion, and severe abdominal pain.    Fever Associated symptoms: diarrhea, headaches and vomiting   Emesis Associated symptoms: diarrhea, fever and headaches   Headache Associated symptoms: diarrhea, fever and vomiting   Diarrhea Associated symptoms: fever, headaches and vomiting     Past Medical History:  Diagnosis Date   Allergy    Asthma    Migraine     Patient Active Problem List   Diagnosis Date Noted   Chronic migraine without aura 04/30/2016   Asthma 09/27/2014   Thoracolumbar back pain 07/13/2014   Idiopathic scoliosis 07/13/2014    History reviewed. No pertinent surgical history.     Home Medications    Prior to Admission medications   Medication Sig Start Date End Date Taking? Authorizing Provider  ondansetron  (ZOFRAN -ODT) 4 MG disintegrating tablet Take 1 tablet (4 mg total) by mouth every 8 (eight) hours as needed for nausea or vomiting. 09/13/23  Yes Johnie, Breandan People A, NP  albuterol  (PROVENTIL  HFA;VENTOLIN  HFA) 108 (90 Base) MCG/ACT inhaler Inhale 2 puffs into the lungs every 6 (six) hours as needed. Patient taking differently: Inhale 2 puffs into the lungs every 6 (six) hours as needed for wheezing or shortness of breath. 11/05/17   Luke Chiquita SAUNDERS, DO  diclofenac  (VOLTAREN ) 75 MG EC tablet Take 1 tablet (75 mg total) by mouth 2 (two) times daily. 10/25/21   Rolinda Rogue, MD  fluticasone  (FLONASE ) 50 MCG/ACT nasal spray Place 2 sprays into both nostrils daily. Patient not taking: Reported on 04/02/2018 12/09/14   Claudene Rayfield HERO, MD  gabapentin   (NEURONTIN ) 300 MG capsule Take 1 capsule (300 mg total) by mouth 3 (three) times daily. 04/06/18   Odis Burnard Jansky, PA-C  montelukast  (SINGULAIR ) 10 MG tablet Take 1 tablet (10 mg total) by mouth at bedtime. Patient not taking: Reported on 04/02/2018 11/05/17   Luke Chiquita SAUNDERS, DO  NONFORMULARY OR COMPOUNDED ITEM Lidocaine Nasal Spray  Use Q2 hours PRN headache 04/05/16   Juliane Che, PA  SUMAtriptan  (IMITREX ) 50 MG tablet Take 1 tablet (50 mg total) by mouth every 2 (two) hours as needed for migraine. May repeat in 2 hours if headache persists or recurs. 04/30/16   Onita Duos, MD    Family History Family History  Problem Relation Age of Onset   Diabetes Mother    Heart disease Father    Hypertension Father    Diabetes Maternal Grandmother    Hypertension Maternal Grandmother    Heart disease Maternal Grandmother    Heart disease Other    Diabetes Other    Depression Other    Hypertension Other    Mental retardation Other    Asthma Other    Anemia Other     Social History Social History   Tobacco Use   Smoking status: Never   Smokeless tobacco: Never  Vaping Use   Vaping status: Never Used  Substance Use Topics   Alcohol use: No    Alcohol/week: 0.0 standard drinks of alcohol   Drug use: No  Allergies   Elemental sulfur   Review of Systems Review of Systems  Constitutional:  Positive for fever.  Gastrointestinal:  Positive for diarrhea and vomiting.  Neurological:  Positive for headaches.   Per HPI  Physical Exam Triage Vital Signs ED Triage Vitals  Encounter Vitals Group     BP 09/13/23 2019 99/64     Systolic BP Percentile --      Diastolic BP Percentile --      Pulse Rate 09/13/23 2019 100     Resp 09/13/23 2019 16     Temp 09/13/23 2019 98.3 F (36.8 C)     Temp Source 09/13/23 2019 Oral     SpO2 09/13/23 2019 96 %     Weight --      Height --      Head Circumference --      Peak Flow --      Pain Score 09/13/23 2017 10     Pain Loc --       Pain Education --      Exclude from Growth Chart --    No data found.  Updated Vital Signs BP 99/64 (BP Location: Left Arm)   Pulse 100   Temp 98.3 F (36.8 C) (Oral)   Resp 16   SpO2 96%   Visual Acuity Right Eye Distance:   Left Eye Distance:   Bilateral Distance:    Right Eye Near:   Left Eye Near:    Bilateral Near:     Physical Exam Vitals and nursing note reviewed.  Constitutional:      General: He is awake.     Appearance: Normal appearance. He is well-developed and well-groomed.  Eyes:     Extraocular Movements: Extraocular movements intact.     Pupils: Pupils are equal, round, and reactive to light.  Cardiovascular:     Rate and Rhythm: Normal rate and regular rhythm.  Pulmonary:     Effort: Pulmonary effort is normal.     Breath sounds: Normal breath sounds.  Abdominal:     General: Bowel sounds are normal.     Palpations: Abdomen is soft.     Tenderness: There is no abdominal tenderness.  Musculoskeletal:        General: Normal range of motion.     Cervical back: Normal range of motion and neck supple.  Skin:    General: Skin is warm and dry.  Neurological:     Mental Status: He is alert and oriented to person, place, and time. Mental status is at baseline.     GCS: GCS eye subscore is 4. GCS verbal subscore is 5. GCS motor subscore is 6.  Psychiatric:        Behavior: Behavior is cooperative.      UC Treatments / Results  Labs (all labs ordered are listed, but only abnormal results are displayed) Labs Reviewed - No data to display  EKG   Radiology No results found.  Procedures Procedures (including critical care time)  Medications Ordered in UC Medications - No data to display  Initial Impression / Assessment and Plan / UC Course  I have reviewed the triage vital signs and the nursing notes.  Pertinent labs & imaging results that were available during my care of the patient were reviewed by me and considered in my medical decision  making (see chart for details).     Patient presented with nausea, vomiting, diarrhea, abdominal cramping, headache, chills, and fever that began last night.  No significant findings upon exam. Nontender upon palpation to abdomen.   Prescribed Zofran  as needed for nausea and vomiting. Recommended OTC medication as needed for other symptoms. Discussed importance of hydration. Discussed return and ER precautions.  Final Clinical Impressions(s) / UC Diagnoses   Final diagnoses:  Viral illness  Nausea vomiting and diarrhea     Discharge Instructions      I believe your symptoms are from a viral illness. You can take Zofran  as needed for nausea and vomiting. You can take Pepto Bismol or Imodium as needed for diarrhea. You can alternate between Tylenol  and Ibuprofen as needed for pain and fever. Stay hydrated and get plenty of rest. Return here if symptoms persist or worsen. If you develop severe abdominal pain, excessive vomiting, high fevers, or passing out please seek immediate medical treatment in the ER.      ED Prescriptions     Medication Sig Dispense Auth. Provider   ondansetron  (ZOFRAN -ODT) 4 MG disintegrating tablet Take 1 tablet (4 mg total) by mouth every 8 (eight) hours as needed for nausea or vomiting. 10 tablet Johnie Flaming A, NP      PDMP not reviewed this encounter.   Johnie Flaming A, NP 09/13/23 2035

## 2023-09-13 NOTE — Discharge Instructions (Signed)
 I believe your symptoms are from a viral illness. You can take Zofran  as needed for nausea and vomiting. You can take Pepto Bismol or Imodium as needed for diarrhea. You can alternate between Tylenol  and Ibuprofen as needed for pain and fever. Stay hydrated and get plenty of rest. Return here if symptoms persist or worsen. If you develop severe abdominal pain, excessive vomiting, high fevers, or passing out please seek immediate medical treatment in the ER.
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
## 2024-01-29 IMAGING — CT CT CERVICAL SPINE W/O CM
3 of 4 series · 13 of 33 positions shown, 16 images · non-contrast
Comparison: None.

CLINICAL DATA: Recent accident with headaches and neck pain,
initial encounter



[Series 9: sag bone · sagittal · 0.38mm/px · 5 of 87 slices shown, 6 images]
[im 29/87  bone]
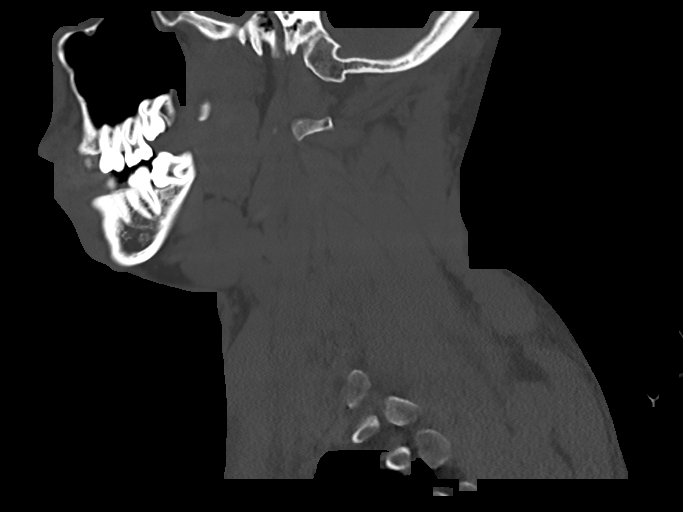
[im 36/87  bone]
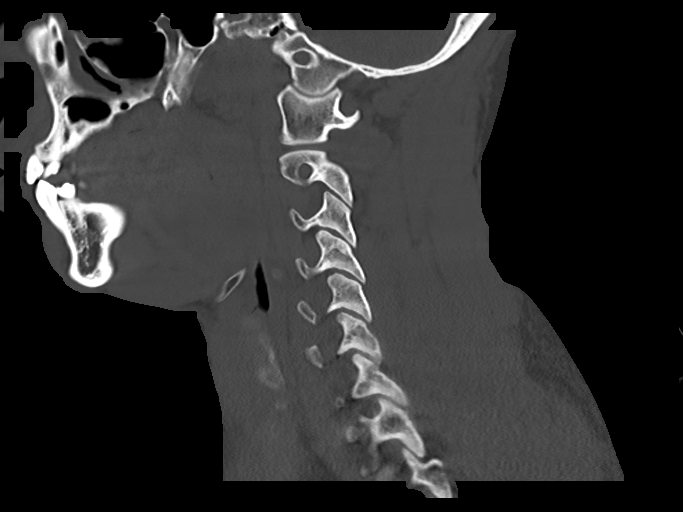
[im 44/87  soft-tissue]
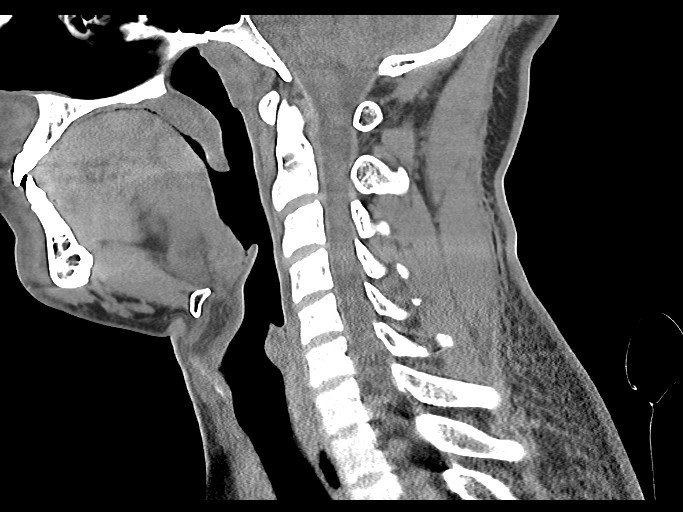
[im 44/87  bone]
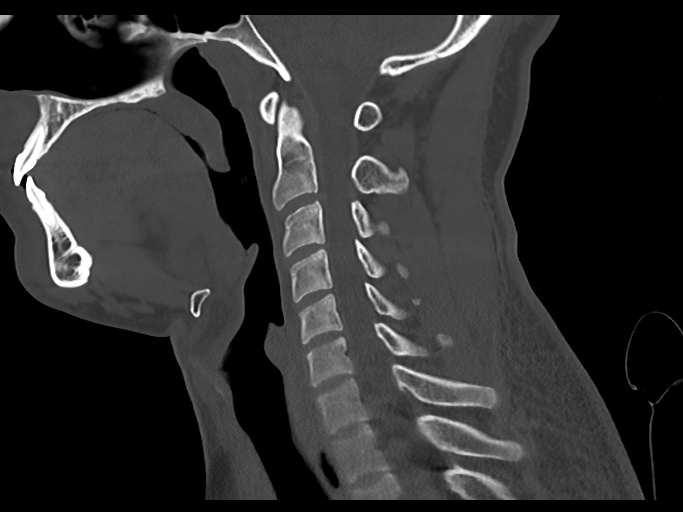
[im 51/87  bone]
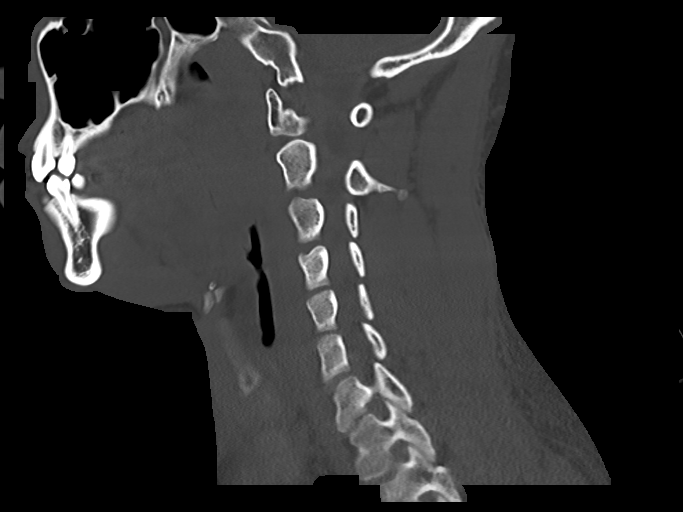
[im 58/87  bone]
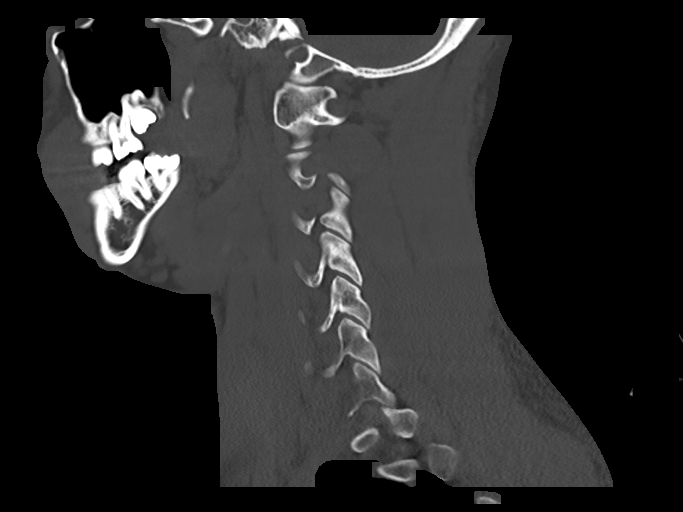

[Series 10: cor bone · coronal · 0.35mm/px · 3 of 131 slices shown]
[im 27/131  bone]
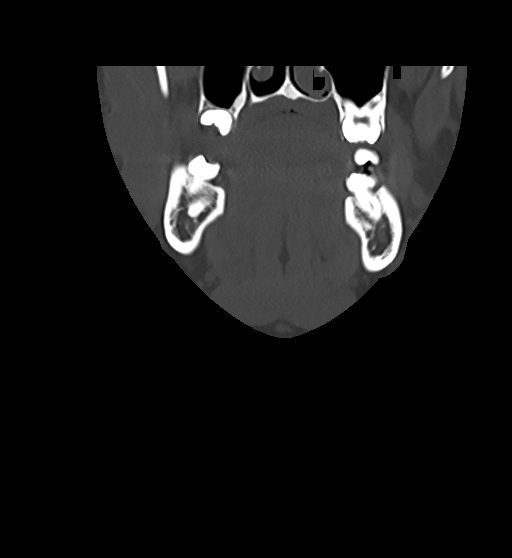
[im 53/131  bone]
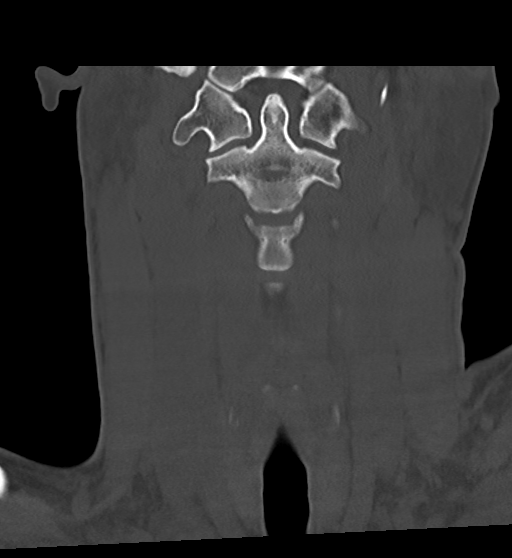
[im 79/131  bone]
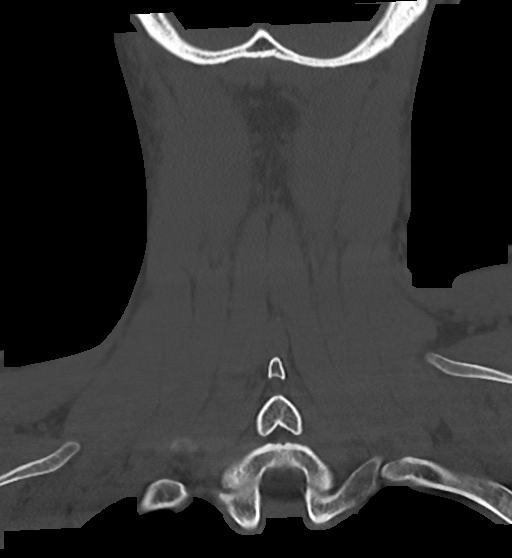

[Series 11: orthogonal axials · axial · 0.21mm/px · z∈[-278,-159]mm · 5 of 89 slices shown, 7 images]
[im 15/89  soft-tissue]
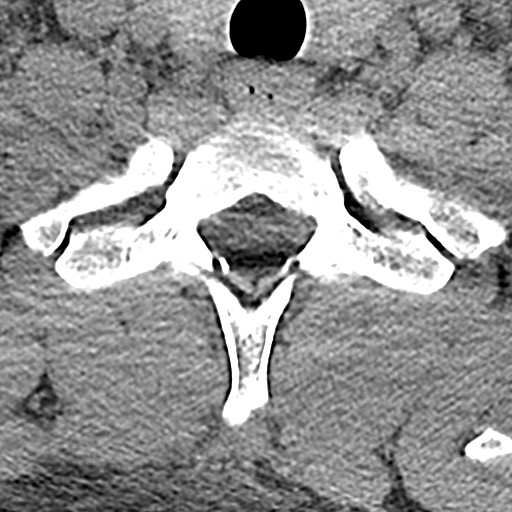
[im 15/89  bone]
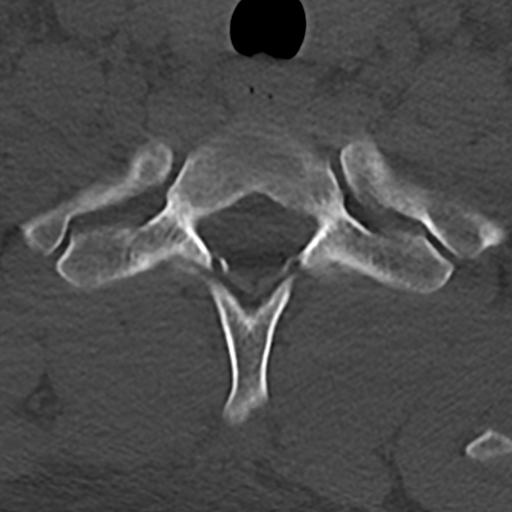
[im 30/89  bone]
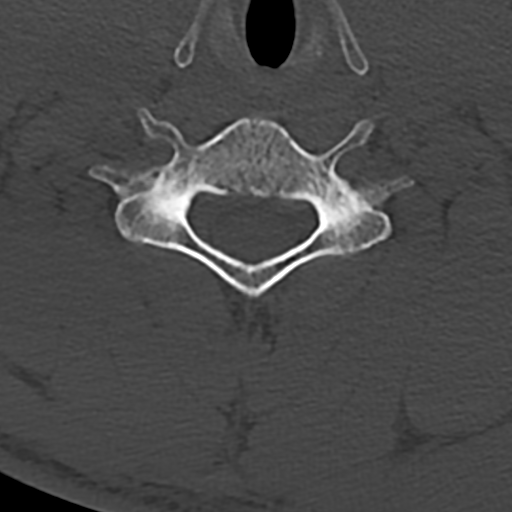
[im 45/89  bone]
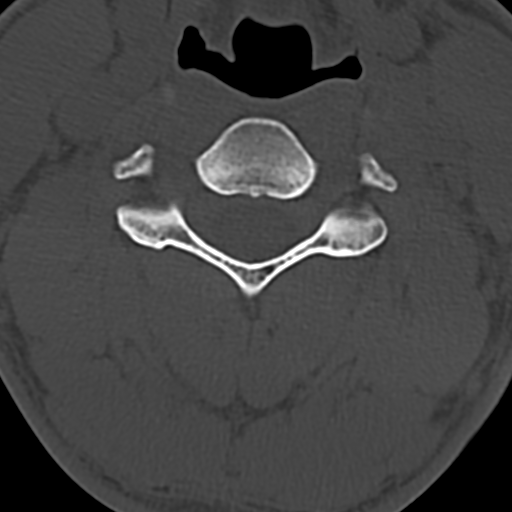
[im 59/89  bone]
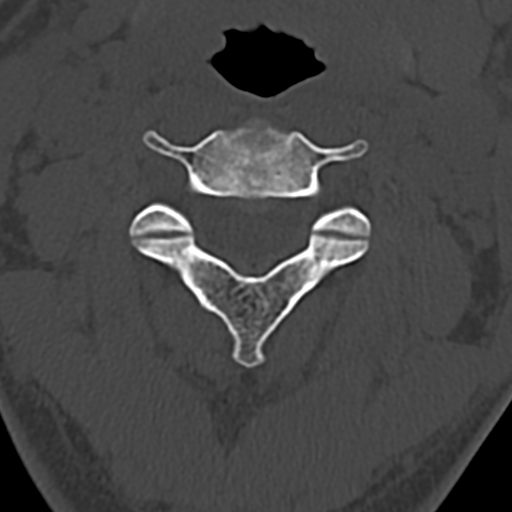
[im 74/89  soft-tissue]
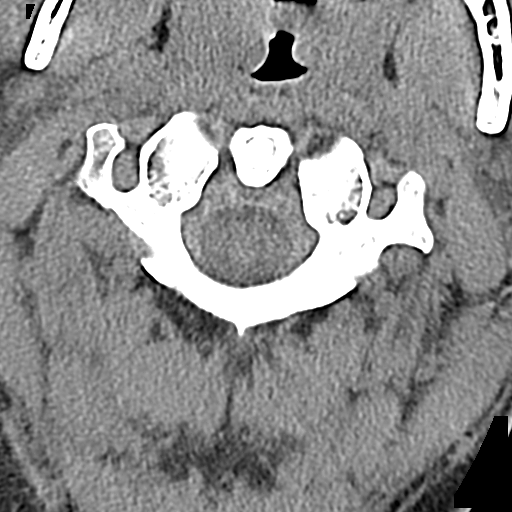
[im 74/89  bone]
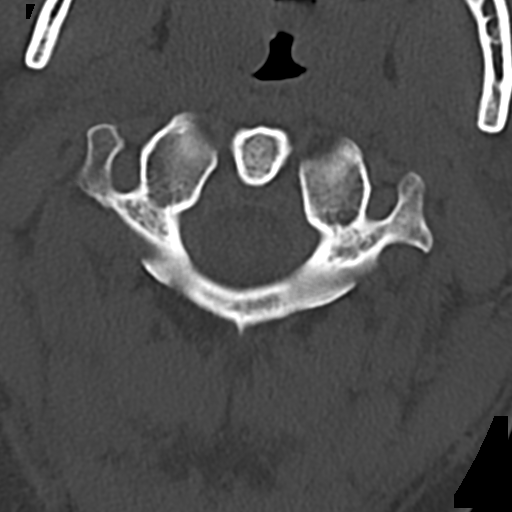

[13 of 33 positions shown; findings below may reference images not displayed]

FINDINGS: CT HEAD FINDINGS

Brain: No evidence of acute infarction, hemorrhage, hydrocephalus,
extra-axial collection or mass lesion/mass effect.

Vascular: No hyperdense vessel or unexpected calcification.

Skull: Normal. Negative for fracture or focal lesion.

Sinuses/Orbits: Mucosal retention cyst is noted in the midline of
frontal sinus.

Other: None.

CT CERVICAL SPINE FINDINGS

Alignment: Within normal limits.

Skull base and vertebrae: 7 cervical segments are well visualized.
Vertebral body height is well maintained. No acute fracture or acute
facet abnormality is noted. The odontoid is within normal limits.

Soft tissues and spinal canal: Surrounding soft tissue structures
are within normal limits.

Upper chest: Visualized lung apices are within normal limits.

Other: None
IMPRESSION: CT of the head: No acute intracranial abnormality is noted.

Frontal sinus mucosal retention cyst.

CT of the cervical spine: No acute abnormality noted.
